# Patient Record
Sex: Female | Born: 1985 | Race: Black or African American | Hispanic: No | Marital: Single
Health system: Southern US, Community
[De-identification: ages and names within clinical notes are randomized; demographics above are authoritative.]

---

## 2020-05-22 ENCOUNTER — Encounter (HOSPITAL_COMMUNITY): Payer: Self-pay | Admitting: Emergency Medicine

## 2020-05-22 DIAGNOSIS — R197 Diarrhea, unspecified: Secondary | ICD-10-CM | POA: Insufficient documentation

## 2020-05-22 DIAGNOSIS — E860 Dehydration: Secondary | ICD-10-CM | POA: Insufficient documentation

## 2020-05-22 DIAGNOSIS — Z20822 Contact with and (suspected) exposure to covid-19: Secondary | ICD-10-CM | POA: Insufficient documentation

## 2020-05-22 DIAGNOSIS — R000 Tachycardia, unspecified: Secondary | ICD-10-CM | POA: Insufficient documentation

## 2020-05-22 DIAGNOSIS — R5383 Other fatigue: Secondary | ICD-10-CM | POA: Insufficient documentation

## 2020-05-22 DIAGNOSIS — J029 Acute pharyngitis, unspecified: Secondary | ICD-10-CM | POA: Insufficient documentation

## 2020-05-22 NOTE — ED Triage Notes (Signed)
Pt reports fatigue, body aches, sore throat, diarrhea and "slight cough" since Jan. 20. States that she took a COVID test least week that was negative. Reports that today is the first day that she has felt good enough to leave the house. A&Ox4. Ambulatory.

## 2020-05-23 ENCOUNTER — Emergency Department (HOSPITAL_COMMUNITY)
Admission: EM | Admit: 2020-05-23 | Discharge: 2020-05-23 | Disposition: A | Discharge status: Home / Self Care | Payer: Self-pay | Attending: Emergency Medicine | Admitting: Emergency Medicine

## 2020-05-23 ENCOUNTER — Emergency Department (HOSPITAL_COMMUNITY): Payer: Self-pay

## 2020-05-23 DIAGNOSIS — Z20822 Contact with and (suspected) exposure to covid-19: Secondary | ICD-10-CM

## 2020-05-23 DIAGNOSIS — E860 Dehydration: Secondary | ICD-10-CM

## 2020-05-23 DIAGNOSIS — R197 Diarrhea, unspecified: Secondary | ICD-10-CM

## 2020-05-23 DIAGNOSIS — R5383 Other fatigue: Secondary | ICD-10-CM

## 2020-05-23 LAB — COMPREHENSIVE METABOLIC PANEL
ALT: 17 U/L (ref 0–44)
AST: 24 U/L (ref 15–41)
Albumin: 4.5 g/dL (ref 3.5–5.0)
Alkaline Phosphatase: 80 U/L (ref 38–126)
Anion gap: 11 (ref 5–15)
BUN: 12 mg/dL (ref 6–20)
CO2: 23 mmol/L (ref 22–32)
Calcium: 9.7 mg/dL (ref 8.9–10.3)
Chloride: 101 mmol/L (ref 98–111)
Creatinine, Ser: 0.7 mg/dL (ref 0.44–1.00)
GFR, Estimated: >60 mL/min (ref >60)
Glucose, Bld: 88 mg/dL (ref 70–99)
Potassium: 3.9 mmol/L (ref 3.5–5.1)
Sodium: 135 mmol/L (ref 135–145)
Total Bilirubin: 0.3 mg/dL (ref 0.3–1.2)
Total Protein: 8.8 g/dL — ABNORMAL HIGH (ref 6.5–8.1)

## 2020-05-23 LAB — CBC
HCT: 38.3 % (ref 36.0–46.0)
Hemoglobin: 12.6 g/dL (ref 12.0–15.0)
MCH: 29.9 pg (ref 26.0–34.0)
MCHC: 32.9 g/dL (ref 30.0–36.0)
MCV: 90.8 fL (ref 80.0–100.0)
Platelets: 324 10*3/uL (ref 150–400)
RBC: 4.22 MIL/uL (ref 3.87–5.11)
RDW: 13.4 % (ref 11.5–15.5)
WBC: 9.5 10*3/uL (ref 4.0–10.5)
nRBC: 0 % (ref 0.0–0.2)

## 2020-05-23 LAB — LIPASE, BLOOD: Lipase: 33 U/L (ref 11–51)

## 2020-05-23 LAB — SARS CORONAVIRUS 2 (TAT 6-24 HRS): SARS Coronavirus 2: NEGATIVE

## 2020-05-23 LAB — I-STAT BETA HCG BLOOD, ED (MC, WL, AP ONLY): I-stat hCG, quantitative: <5 m[IU]/mL (ref <5)

## 2020-05-23 MED ORDER — ONDANSETRON HCL 4 MG PO TABS: 4.0000 mg | ORAL_TABLET | Freq: Three times a day (TID) | ORAL | 0 refills | Status: AC | PRN

## 2020-05-23 MED ORDER — SODIUM CHLORIDE 0.9 % IV BOLUS
1000.0000 mL | Freq: Once | INTRAVENOUS | Status: AC
  Administered 2020-05-23: 1000 mL via INTRAVENOUS

## 2020-05-23 NOTE — Discharge Instructions (Signed)
1. Medications: Alternate tylenol and ibuprofen for fever control, continue usual home medications 2. Treatment: rest, drink plenty of fluids,  3. Follow Up: Please followup with your primary doctor if your symptoms are not improving after 10-14 days; Please return to the ER for high fevers, persistent vomiting, shortness of breath or other concerns.

## 2020-05-23 NOTE — ED Provider Notes (Signed)
[facility]-EMERGENCY DEPT Provider Note   CSN: [id number] Arrival date & time: 05/22/20  2304     History Chief Complaint  Patient presents with  . Fatigue    Madeline Petty is a 35 y.o. female presents to the Emergency Department complaining of gradual, persistent, progressively worsening fatigue with associated body aches, sore throat, mild cough, fever to 102, diarrhea onset January 20.  Patient reports she was tested for Covid last week and was negative.  She is unvaccinated.  She reports feeling so poorly for the last few weeks that she has largely not left the house.  She reports that today she attempted to go to work but had a bout of diarrhea and became concerned that she remains ill.  No treatments or evaluations prior to today.  She denies headache, neck pain, neck stiffness, chest pain, shortness of breath, palpitations, abdominal pain, vomiting, weakness, dizziness, syncope.  Patient reports everyone at work is sick with similar.  The history is provided by the patient and medical records. No language interpreter was used.       History reviewed. No pertinent past medical history.  There are no problems to display for this patient.    OB History   No obstetric history on file.     History reviewed. No pertinent family history.     Home Medications Prior to Admission medications   Medication Sig Start Date End Date Taking? Authorizing Provider  ondansetron (ZOFRAN) 4 MG tablet Take 1 tablet (4 mg total) by mouth every 8 (eight) hours as needed for nausea or vomiting. 05/23/20  Yes Susan Arana, Dahlia Client, PA-C    Allergies    Patient has no known allergies.  Review of Systems   Review of Systems  Constitutional: Positive for appetite change, fatigue and fever. Negative for diaphoresis and unexpected weight change.  HENT: Positive for sore throat. Negative for mouth sores.   Eyes: Negative for visual disturbance.  Respiratory: Positive for  cough. Negative for chest tightness, shortness of breath and wheezing.   Cardiovascular: Negative for chest pain.  Gastrointestinal: Positive for diarrhea and nausea. Negative for abdominal pain, constipation and vomiting.  Endocrine: Negative for polydipsia, polyphagia and polyuria.  Genitourinary: Negative for dysuria, frequency, hematuria and urgency.  Musculoskeletal: Positive for myalgias. Negative for back pain and neck stiffness.  Skin: Negative for rash.  Allergic/Immunologic: Negative for immunocompromised state.  Neurological: Negative for syncope, light-headedness and headaches.  Hematological: Does not bruise/bleed easily.  Psychiatric/Behavioral: Negative for sleep disturbance. The patient is not nervous/anxious.     Physical Exam Updated Vital Signs BP (!) 149/92   Pulse (!) 117   Temp 98 F (36.7 C) (Oral)   Resp 16   LMP  (LMP Unknown)   SpO2 100%   Physical Exam Vitals and nursing note reviewed.  Constitutional:      General: She is not in acute distress.    Appearance: She is not diaphoretic.  HENT:     Head: Normocephalic.     Mouth/Throat:     Mouth: Mucous membranes are dry.  Eyes:     General: No scleral icterus.    Conjunctiva/sclera: Conjunctivae normal.  Cardiovascular:     Rate and Rhythm: Regular rhythm. Tachycardia present.     Pulses: Normal pulses.          Radial pulses are 2+ on the right side and 2+ on the left side.  Pulmonary:     Effort: Pulmonary effort is normal. No tachypnea, accessory muscle  usage, prolonged expiration, respiratory distress or retractions.     Breath sounds: Normal breath sounds. No stridor.     Comments: Equal chest rise. No increased work of breathing. Abdominal:     General: There is no distension.     Palpations: Abdomen is soft.     Tenderness: There is no abdominal tenderness. There is no guarding or rebound.  Musculoskeletal:     Cervical back: Normal range of motion.     Comments: Moves all extremities  equally and without difficulty.  Skin:    General: Skin is warm and dry.     Capillary Refill: Capillary refill takes less than 2 seconds.  Neurological:     Mental Status: She is alert.     GCS: GCS eye subscore is 4. GCS verbal subscore is 5. GCS motor subscore is 6.     Comments: Speech is clear and goal oriented.  Psychiatric:        Mood and Affect: Mood normal.     ED Results / Procedures / Treatments   Labs (all labs ordered are listed, but only abnormal results are displayed) Labs Reviewed  COMPREHENSIVE METABOLIC PANEL - Abnormal; Notable for the following components:      Result Value   Total Protein 8.8 (*)    All other components within normal limits  SARS CORONAVIRUS 2 (TAT 6-24 HRS)  CBC  LIPASE, BLOOD  I-STAT BETA HCG BLOOD, ED ([facility], [facility], [facility] ONLY)     Radiology DG Chest Port 1 View  Result Date: 05/23/2020 CLINICAL DATA:  Cough. EXAM: PORTABLE CHEST 1 VIEW COMPARISON:  None. FINDINGS: The heart size and mediastinal contours are within normal limits. Both lungs are clear. The visualized skeletal structures are unremarkable. IMPRESSION: No active disease. Electronically Signed   By: Katherine Mantle M.D.   On: 05/23/2020 01:19    Procedures Procedures   Medications Ordered in ED Medications  sodium chloride 0.9 % bolus 1,000 mL (0 mLs Intravenous Stopped 05/23/20 0200)    ED Course  I have reviewed the triage vital signs and the nursing notes.  Pertinent labs & imaging results that were available during my care of the patient were reviewed by me and considered in my medical decision making (see chart for details).    MDM Rules/Calculators/A&P                            Patient presents with influenza-like illness, tachycardic on arrival.  Considering COVID-19, influenza, sepsis, pulmonary embolism, dehydration, viral illness.  Additional history obtained:  Previous records obtained and reviewed.    Lab Tests:  I Ordered, reviewed, and  interpreted labs, which included:  CBC, CMP, lipase, pregnancy test, Covid swab. Labs are reassuring.  No leukocytosis, elevated hepatic enzymes or electrolyte abnormalities.    Imaging Studies ordered:  I have placed order for imaging including chest x-ray. I personally reviewed and interpreted imaging.  No evidence of widened mediastinum, pulmonary edema, pneumothorax or pneumonia.   ED Course:  Patient presents with influenza-like illness.  Tachycardia on arrival.  Mild dehydration noted.  Abdomen soft and nontender.  Patient without shortness of breath, leg swelling or increased risk for DVT.  Highly doubt pulmonary embolism.  Labs reassuring.  Tachycardia resolved with fluid bolus.  Patient reports she feels significantly better.  Will discharge home with close primary care follow-up.  Covid test pending.  Patient voiced understanding and agreement wit the current medical evaluation and treatment  plan.  Questions were answered to expressed satisfaction.    Strict return precautions given and patient is stable at time of discharge.    Portions of this note were generated with Scientist, clinical (histocompatibility and immunogenetics). Dictation errors may occur despite best attempts at proofreading.   Final Clinical Impression(s) / ED Diagnoses Final diagnoses:  Dehydration  Fatigue, unspecified type  Diarrhea of presumed infectious origin  Suspected COVID-19 virus infection    Rx / DC Orders ED Discharge Orders         Ordered    ondansetron (ZOFRAN) 4 MG tablet  Every 8 hours PRN        05/23/20 0258           Avigayil Ton, Dahlia Client, PA-C 05/23/20 0259    Molpus, Jonny Ruiz, MD 05/23/20 [phone]

## 2021-09-01 IMAGING — DX DG CHEST 1V PORT
1 series · 1 of 1 positions shown · non-contrast
Comparison: None.

CLINICAL DATA: Cough.

EXAM:
PORTABLE CHEST 1 VIEW

[chest ap]
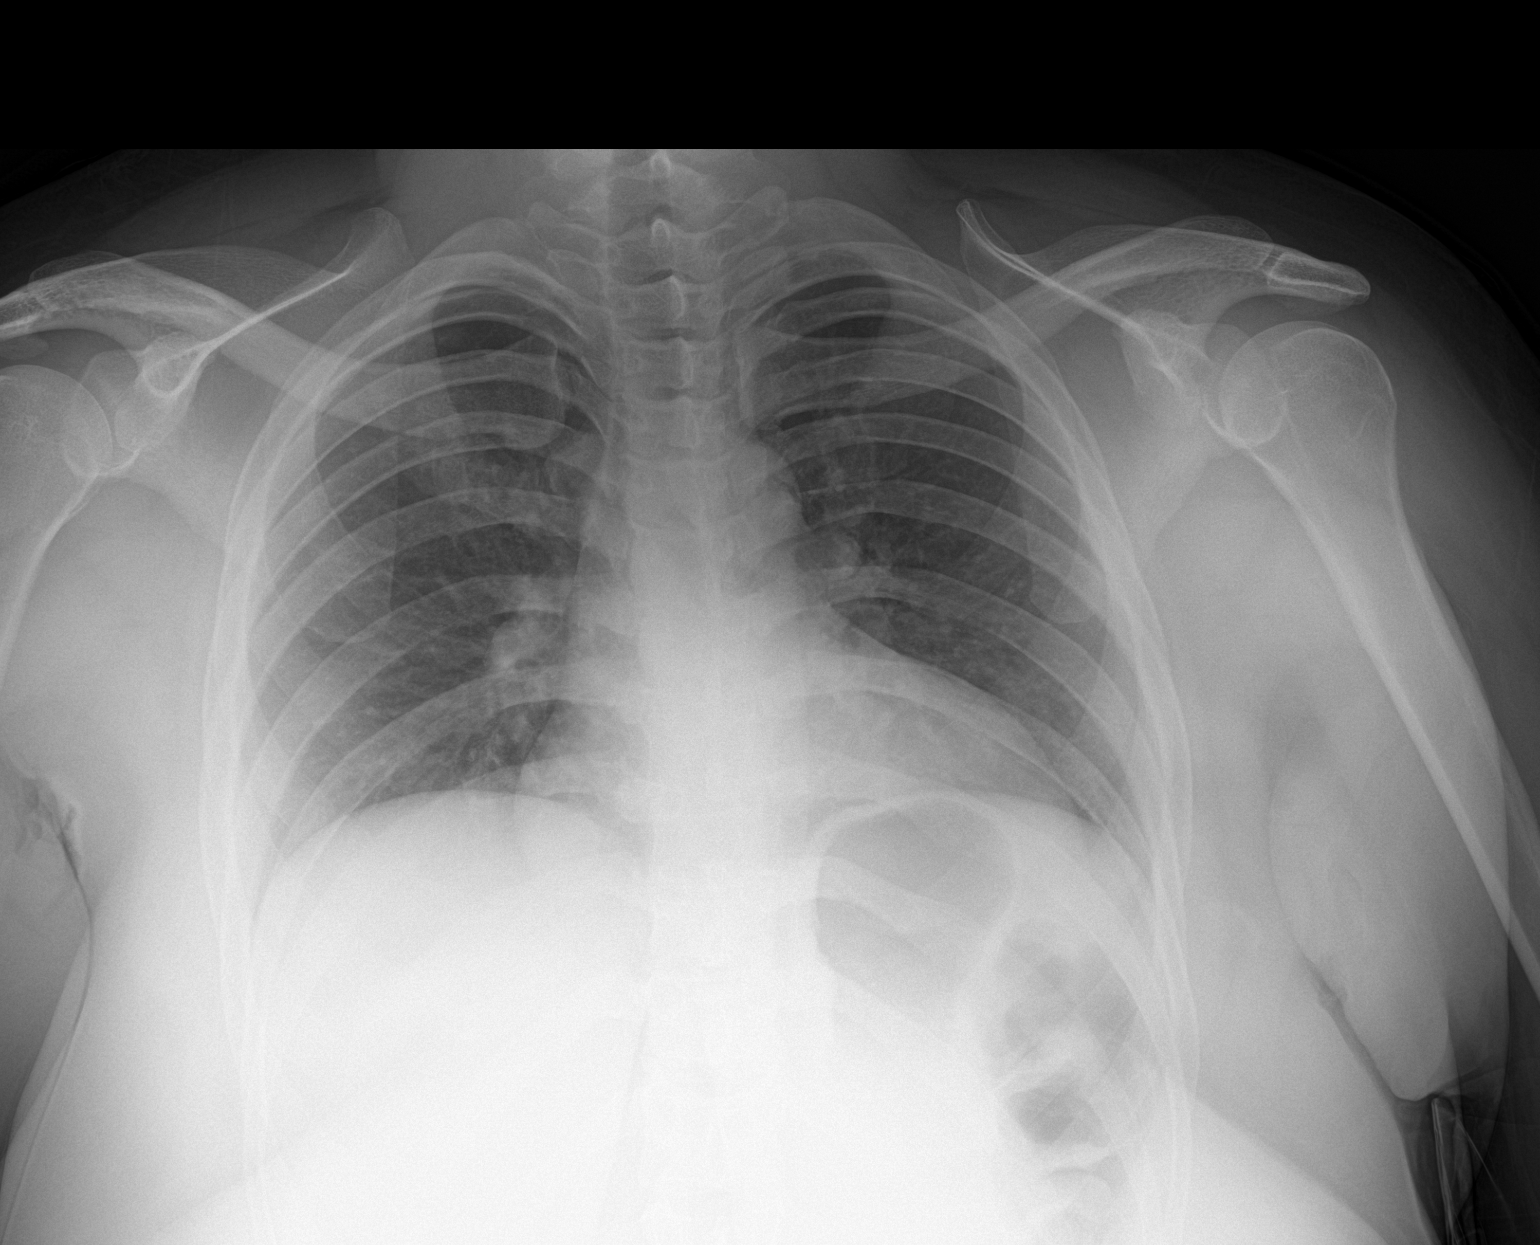

[1 of 1 positions shown; findings below may reference images not displayed]

FINDINGS: The heart size and mediastinal contours are within normal limits.
Both lungs are clear. The visualized skeletal structures are
unremarkable.
IMPRESSION: No active disease.
